# Patient Record
Sex: Female | Born: 1970 | Race: Black or African American | Hispanic: No | Smoking: Never smoker
Health system: Southern US, Community
[De-identification: ages and names within clinical notes are randomized; demographics above are authoritative.]

## PROBLEM LIST (undated history)

## (undated) DIAGNOSIS — E119 Type 2 diabetes mellitus without complications: Secondary | ICD-10-CM

## (undated) DIAGNOSIS — I10 Essential (primary) hypertension: Secondary | ICD-10-CM

---
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
## 2001-03-28 ENCOUNTER — Other Ambulatory Visit: Admission: RE | Admit: 2001-03-28 | Discharge: 2001-03-28 | Payer: Self-pay | Admitting: Obstetrics and Gynecology

## 2002-08-13 ENCOUNTER — Other Ambulatory Visit: Admission: RE | Admit: 2002-08-13 | Discharge: 2002-08-13 | Payer: Self-pay | Admitting: *Deleted

## 2004-03-27 ENCOUNTER — Other Ambulatory Visit: Admission: RE | Admit: 2004-03-27 | Discharge: 2004-03-27 | Payer: Self-pay | Admitting: Gynecology

## 2004-11-08 ENCOUNTER — Encounter: Admission: RE | Admit: 2004-11-08 | Discharge: 2004-11-08 | Payer: Self-pay | Admitting: Obstetrics and Gynecology

## 2004-12-14 ENCOUNTER — Inpatient Hospital Stay (HOSPITAL_COMMUNITY): Admission: AD | Admit: 2004-12-14 | Discharge: 2004-12-14 | Payer: Self-pay | Admitting: Obstetrics and Gynecology

## 2004-12-29 ENCOUNTER — Inpatient Hospital Stay (HOSPITAL_COMMUNITY): Admission: AD | Admit: 2004-12-29 | Discharge: 2004-12-29 | Payer: Self-pay | Admitting: Obstetrics and Gynecology

## 2004-12-29 ENCOUNTER — Inpatient Hospital Stay (HOSPITAL_COMMUNITY): Admission: AD | Admit: 2004-12-29 | Discharge: 2005-01-01 | Payer: Self-pay | Admitting: Obstetrics and Gynecology

## 2005-01-02 ENCOUNTER — Encounter: Admission: RE | Admit: 2005-01-02 | Discharge: 2005-02-01 | Payer: Self-pay | Admitting: Obstetrics and Gynecology

## 2005-02-02 ENCOUNTER — Encounter: Admission: RE | Admit: 2005-02-02 | Discharge: 2005-03-04 | Payer: Self-pay | Admitting: Obstetrics and Gynecology

## 2005-03-05 ENCOUNTER — Encounter: Admission: RE | Admit: 2005-03-05 | Discharge: 2005-04-03 | Payer: Self-pay | Admitting: Obstetrics and Gynecology

## 2005-04-04 ENCOUNTER — Encounter: Admission: RE | Admit: 2005-04-04 | Discharge: 2005-05-04 | Payer: Self-pay | Admitting: Obstetrics and Gynecology

## 2005-05-05 ENCOUNTER — Encounter: Admission: RE | Admit: 2005-05-05 | Discharge: 2005-06-03 | Payer: Self-pay | Admitting: Obstetrics and Gynecology

## 2005-06-04 ENCOUNTER — Encounter: Admission: RE | Admit: 2005-06-04 | Discharge: 2005-07-04 | Payer: Self-pay | Admitting: Obstetrics and Gynecology

## 2005-07-05 ENCOUNTER — Encounter: Admission: RE | Admit: 2005-07-05 | Discharge: 2005-08-04 | Payer: Self-pay | Admitting: Obstetrics and Gynecology

## 2005-08-05 ENCOUNTER — Encounter: Admission: RE | Admit: 2005-08-05 | Discharge: 2005-09-01 | Payer: Self-pay | Admitting: Obstetrics and Gynecology

## 2005-09-02 ENCOUNTER — Encounter: Admission: RE | Admit: 2005-09-02 | Discharge: 2005-10-02 | Payer: Self-pay | Admitting: Obstetrics and Gynecology

## 2005-10-03 ENCOUNTER — Encounter: Admission: RE | Admit: 2005-10-03 | Discharge: 2005-10-11 | Payer: Self-pay | Admitting: Obstetrics and Gynecology

## 2006-08-24 LAB — CONVERTED CEMR LAB: Pap Smear: NORMAL

## 2007-05-20 ENCOUNTER — Ambulatory Visit: Payer: Self-pay | Admitting: Internal Medicine

## 2007-05-20 DIAGNOSIS — O99810 Abnormal glucose complicating pregnancy: Secondary | ICD-10-CM | POA: Insufficient documentation

## 2007-05-20 LAB — CONVERTED CEMR LAB
Blood in Urine, dipstick: NEGATIVE
Glucose, Urine, Semiquant: NEGATIVE
Nitrite: NEGATIVE
Specific Gravity, Urine: 1.02
Urobilinogen, UA: 0.2
WBC Urine, dipstick: NEGATIVE
pH: 7

## 2007-05-21 LAB — CONVERTED CEMR LAB
ALT: 20 units/L (ref 0–35)
AST: 17 units/L (ref 0–37)
Albumin: 3.4 g/dL — ABNORMAL LOW (ref 3.5–5.2)
Alkaline Phosphatase: 69 units/L (ref 39–117)
BUN: 10 mg/dL (ref 6–23)
Basophils Absolute: 0 10*3/uL (ref 0.0–0.1)
Basophils Relative: 0.4 % (ref 0.0–1.0)
Bilirubin, Direct: 0.1 mg/dL (ref 0.0–0.3)
CO2: 30 meq/L (ref 19–32)
Calcium: 8.9 mg/dL (ref 8.4–10.5)
Chloride: 104 meq/L (ref 96–112)
Cholesterol: 166 mg/dL (ref 0–200)
Creatinine, Ser: 0.8 mg/dL (ref 0.4–1.2)
Eosinophils Absolute: 0.3 10*3/uL (ref 0.0–0.6)
Eosinophils Relative: 3.1 % (ref 0.0–5.0)
GFR calc Af Amer: 106 mL/min
GFR calc non Af Amer: 87 mL/min
Glucose, Bld: 90 mg/dL (ref 70–99)
HCT: 38.2 % (ref 36.0–46.0)
HDL: 31.8 mg/dL — ABNORMAL LOW (ref >39.0)
Hemoglobin: 12.8 g/dL (ref 12.0–15.0)
LDL Cholesterol: 121 mg/dL — ABNORMAL HIGH (ref 0–99)
Lymphocytes Relative: 28.6 % (ref 12.0–46.0)
MCHC: 33.5 g/dL (ref 30.0–36.0)
MCV: 89.8 fL (ref 78.0–100.0)
Monocytes Absolute: 0.7 10*3/uL (ref 0.2–0.7)
Monocytes Relative: 7.7 % (ref 3.0–11.0)
Neutro Abs: 5.4 10*3/uL (ref 1.4–7.7)
Neutrophils Relative %: 60.2 % (ref 43.0–77.0)
Platelets: 337 10*3/uL (ref 150–400)
Potassium: 4.3 meq/L (ref 3.5–5.1)
RBC: 4.25 M/uL (ref 3.87–5.11)
RDW: 13.7 % (ref 11.5–14.6)
Sodium: 140 meq/L (ref 135–145)
TSH: 1.55 microintl units/mL (ref 0.35–5.50)
Total Bilirubin: 0.6 mg/dL (ref 0.3–1.2)
Total CHOL/HDL Ratio: 5.2
Total Protein: 6.6 g/dL (ref 6.0–8.3)
Triglycerides: 66 mg/dL (ref 0–149)
VLDL: 13 mg/dL (ref 0–40)
WBC: 9 10*3/uL (ref 4.5–10.5)

## 2008-01-21 ENCOUNTER — Ambulatory Visit: Payer: Self-pay | Admitting: Internal Medicine

## 2008-01-21 LAB — CONVERTED CEMR LAB
ALT: 28 units/L (ref 0–35)
AST: 19 units/L (ref 0–37)
Albumin: 3.7 g/dL (ref 3.5–5.2)
BUN: 13 mg/dL (ref 6–23)
Basophils Relative: 0.7 % (ref 0.0–3.0)
Chloride: 105 meq/L (ref 96–112)
Creatinine, Ser: 0.9 mg/dL (ref 0.4–1.2)
Eosinophils Absolute: 0.3 10*3/uL (ref 0.0–0.7)
Eosinophils Relative: 4.5 % (ref 0.0–5.0)
GFR calc non Af Amer: 76 mL/min
Glucose, Urine, Semiquant: NEGATIVE
HCT: 40.9 % (ref 36.0–46.0)
MCV: 90.7 fL (ref 78.0–100.0)
Monocytes Absolute: 0.6 10*3/uL (ref 0.1–1.0)
Neutrophils Relative %: 55 % (ref 43.0–77.0)
RBC: 4.51 M/uL (ref 3.87–5.11)
Specific Gravity, Urine: 1.015
TSH: 1.25 microintl units/mL (ref 0.35–5.50)
WBC Urine, dipstick: NEGATIVE
WBC: 7 10*3/uL (ref 4.5–10.5)
pH: 7

## 2008-01-28 ENCOUNTER — Ambulatory Visit: Payer: Self-pay | Admitting: Internal Medicine

## 2009-06-29 ENCOUNTER — Encounter: Admission: RE | Admit: 2009-06-29 | Discharge: 2009-09-27 | Payer: Self-pay | Admitting: Certified Nurse Midwife

## 2009-10-05 ENCOUNTER — Encounter: Admission: RE | Admit: 2009-10-05 | Discharge: 2009-10-05 | Payer: Self-pay | Admitting: Certified Nurse Midwife

## 2010-01-23 ENCOUNTER — Encounter: Admission: RE | Admit: 2010-01-23 | Discharge: 2010-01-23 | Payer: Self-pay | Admitting: Certified Nurse Midwife

## 2010-02-23 ENCOUNTER — Inpatient Hospital Stay (HOSPITAL_COMMUNITY): Admission: AD | Admit: 2010-02-23 | Discharge: 2010-02-23 | Payer: Self-pay | Admitting: Obstetrics & Gynecology

## 2010-02-28 ENCOUNTER — Inpatient Hospital Stay (HOSPITAL_COMMUNITY): Admission: AD | Admit: 2010-02-28 | Discharge: 2010-02-28 | Payer: Self-pay | Admitting: Obstetrics

## 2010-03-05 ENCOUNTER — Inpatient Hospital Stay (HOSPITAL_COMMUNITY): Admission: AD | Admit: 2010-03-05 | Discharge: 2010-03-07 | Payer: Self-pay | Admitting: Obstetrics

## 2010-06-01 ENCOUNTER — Encounter: Admit: 2010-06-01 | Payer: Self-pay | Admitting: Certified Nurse Midwife

## 2010-07-25 ENCOUNTER — Emergency Department (HOSPITAL_COMMUNITY)
Admission: EM | Admit: 2010-07-25 | Discharge: 2010-07-25 | Disposition: A | Discharge status: Home / Self Care | Payer: BC Managed Care – PPO | Attending: Emergency Medicine | Admitting: Emergency Medicine

## 2010-07-25 DIAGNOSIS — R51 Headache: Secondary | ICD-10-CM | POA: Insufficient documentation

## 2010-07-25 DIAGNOSIS — R0602 Shortness of breath: Secondary | ICD-10-CM | POA: Insufficient documentation

## 2010-09-07 LAB — COMPREHENSIVE METABOLIC PANEL
ALT: 10 U/L (ref 0–35)
AST: 14 U/L (ref 0–37)
AST: 15 U/L (ref 0–37)
AST: 16 U/L (ref 0–37)
Albumin: 2.6 g/dL — ABNORMAL LOW (ref 3.5–5.2)
Alkaline Phosphatase: 314 U/L — ABNORMAL HIGH (ref 39–117)
BUN: 10 mg/dL (ref 6–23)
BUN: 8 mg/dL (ref 6–23)
CO2: 23 mEq/L (ref 19–32)
CO2: 23 mEq/L (ref 19–32)
CO2: 23 mEq/L (ref 19–32)
Calcium: 8.7 mg/dL (ref 8.4–10.5)
Calcium: 9.8 mg/dL (ref 8.4–10.5)
Chloride: 104 mEq/L (ref 96–112)
Chloride: 107 mEq/L (ref 96–112)
Chloride: 109 mEq/L (ref 96–112)
Creatinine, Ser: 0.64 mg/dL (ref 0.4–1.2)
Creatinine, Ser: 0.66 mg/dL (ref 0.4–1.2)
Creatinine, Ser: 0.71 mg/dL (ref 0.4–1.2)
GFR calc Af Amer: >60 mL/min (ref >60)
GFR calc Af Amer: >60 mL/min (ref >60)
GFR calc Af Amer: >60 mL/min (ref >60)
GFR calc non Af Amer: >60 mL/min (ref >60)
GFR calc non Af Amer: >60 mL/min (ref >60)
GFR calc non Af Amer: >60 mL/min (ref >60)
Glucose, Bld: 109 mg/dL — ABNORMAL HIGH (ref 70–99)
Potassium: 4 mEq/L (ref 3.5–5.1)
Sodium: 136 mEq/L (ref 135–145)
Total Bilirubin: 0.1 mg/dL — ABNORMAL LOW (ref 0.3–1.2)
Total Bilirubin: 0.3 mg/dL (ref 0.3–1.2)
Total Bilirubin: 0.6 mg/dL (ref 0.3–1.2)
Total Protein: 6.7 g/dL (ref 6.0–8.3)

## 2010-09-07 LAB — CBC
HCT: 30 % — ABNORMAL LOW (ref 36.0–46.0)
HCT: 35.7 % — ABNORMAL LOW (ref 36.0–46.0)
Hemoglobin: 10.1 g/dL — ABNORMAL LOW (ref 12.0–15.0)
Hemoglobin: 11.7 g/dL — ABNORMAL LOW (ref 12.0–15.0)
Hemoglobin: 11.7 g/dL — ABNORMAL LOW (ref 12.0–15.0)
Hemoglobin: 11.8 g/dL — ABNORMAL LOW (ref 12.0–15.0)
MCH: 28.9 pg (ref 26.0–34.0)
MCH: 29 pg (ref 26.0–34.0)
MCH: 29.4 pg (ref 26.0–34.0)
MCHC: 32.9 g/dL (ref 30.0–36.0)
MCHC: 33.2 g/dL (ref 30.0–36.0)
MCV: 87.6 fL (ref 78.0–100.0)
MCV: 87.8 fL (ref 78.0–100.0)
MCV: 88 fL (ref 78.0–100.0)
Platelets: 294 10*3/uL (ref 150–400)
RBC: 3.41 MIL/uL — ABNORMAL LOW (ref 3.87–5.11)
RBC: 4.03 MIL/uL (ref 3.87–5.11)
RBC: 4.07 MIL/uL (ref 3.87–5.11)
RDW: 14.5 % (ref 11.5–15.5)

## 2010-09-07 LAB — URIC ACID
Uric Acid, Serum: 4.6 mg/dL (ref 2.4–7.0)
Uric Acid, Serum: 4.7 mg/dL (ref 2.4–7.0)

## 2010-09-07 LAB — URINALYSIS, ROUTINE W REFLEX MICROSCOPIC
Bilirubin Urine: NEGATIVE
Glucose, UA: NEGATIVE mg/dL
Ketones, ur: 15 mg/dL — AB
Protein, ur: NEGATIVE mg/dL

## 2010-09-07 LAB — GLUCOSE, CAPILLARY: Glucose-Capillary: 97 mg/dL (ref 70–99)

## 2010-09-07 LAB — RPR: RPR Ser Ql: NONREACTIVE

## 2010-09-07 LAB — LACTATE DEHYDROGENASE: LDH: 205 U/L (ref 94–250)

## 2010-11-10 NOTE — H&P (Signed)
NAMESena Hoffman, [name]                ACCOUNT NO.:  [id number]   MEDICAL RECORD NO.:  [id number]           PATIENT TYPE:   LOCATION:                                 FACILITY:   PHYSICIAN:  Richardean Sale, M.D.   DATE OF BIRTH:  02-23-1971   DATE OF ADMISSION:  12/29/2004  DATE OF DISCHARGE:                                HISTORY & PHYSICAL   ADMISSION DIAGNOSES:  37 plus week intrauterine pregnancy with  oligohydramnios, possible rupture of membranes for induction of labor.   HISTORY OF PRESENT ILLNESS:  This is a 40 year old, gravida 1, para 0,  African-American female who is currently 37 weeks and 5 days by first  trimester ultrasound with a due date of January 14, 2005 who had complaints of  questionable loss of fluid. Makayla and Nitrazine tests were negative, however,  ultrasound was obtained which revealed an AFI of only 4.2 cm. Given this  finding, the patient is being admitted for induction of labor with presumed  rupture of membranes. She denies any significant contractions, denies any  vaginal bleeding and reports good movement. Prenatal care has been at  [facility] with Dr. Richardean Sale as the primary attending. Pregnancy  is complicated by gestational diabetes which is diet controlled and obesity.  Ultrasound performed on December 22, 2004 revealed estimated fetal weight of  3260 g which is the 78 percentile and an AFI of 11.   PAST OBSTETRIC HISTORY:  Gravida 1, para 0.   GYNECOLOGIC HISTORY:  Positive for cryosurgery for abnormal Pap smear.  Denies any history of HSV or other sexually transmitted infections.   PAST MEDICAL HISTORY:  No prior hospitalizations.   PAST SURGICAL HISTORY:  Right wrist fracture March of 2004, right breast  mass removed in 1989 which is benign and cyst removed from right wrist was  benign.   FAMILY HISTORY:  Positive for diabetes and hypertension, negative for any  birth defects or congenital anomalies or cystic fibrosis.   SOCIAL HISTORY:   She denies tobacco, alcohol or drugs. Father of the baby is  supportive. She works as a Firefighter.   PHYSICAL EXAMINATION:  Weight is 233 pounds, blood pressure 120/72.  GENERAL:  She is an obese black female who is in no acute distress.  HEART:  Regular rate and rhythm.  LUNGS:  Clear to auscultation bilaterally.  ABDOMEN:  Gravid, soft, nontender. Fundal height is 39.  EXTREMITIES:  Within normal limits.  SKIN:  Shows vitiligo.   On speculum exam, there is some white fluid present in the vault which is  negative for Nitrazine or ferning. Cervix is closed, soft, 50%, -2 station.  Ultrasound today shows an AFI of only 4 but cephalic presentation.   PRENATAL LABS:  Blood pressure type B+, antibody screen negative, RPR  nonreactive, rubella immune. Hepatitis B surface antigen nonreactive, HIV  nonreactive. Sickle cell trait normal, thalassemia negative. One hour  Glucola was 153, three hour glucose challenge 98, 179, 202, 150. Group B  beta strep negative on December 14, 2004. Declined triple screen or any  antenatal testing. Anatomy ultrasound  within normal limits. Fasting blood  sugars are all less than 95. Two hour post prandial is less than 120 with  only an occasional elevated value.   ASSESSMENT:  A 40 year old, gravida 1, para 0, black female with gestational  diabetes diet controlled now with oligohydramnios and presumed rupture of  membranes, unfavorable cervix.   PLAN:  Will admit to Labor and Delivery for continuous monitoring and  cervical ripening with Cervidil. Given unfavorable cervix, I explained to  the patient she is at increased risk for a cesarean section. The patient  voiced an understanding of the above. Will begin Cervidil and start low dose  pitocin after ripening achieved.       JW/MEDQ  D:  12/29/2004  T:  12/29/2004  Job:  [id number]

## 2010-11-10 NOTE — H&P (Signed)
NAME:  Makayla Hoffman, Makayla Hoffman                ACCOUNT NO.:  [id number]   MEDICAL RECORD NO.:  [id number]          PATIENT TYPE:  INP   LOCATION:  9170                          FACILITY:  [facility]   PHYSICIAN:  Owendale B. Earlene Plater, M.D.  DATE OF BIRTH:  May 02, 1971   DATE OF ADMISSION:  12/29/2004  DATE OF DISCHARGE:                                HISTORY & PHYSICAL   ADMISSION DIAGNOSES:  1.  Thirty-seven week intrauterine pregnancy.  2.  Questionable prolonged rupture of membranes.  3.  Oligohydramnios.  4.  Gestational diabetes.   HISTORY OF PRESENT ILLNESS:  A 40 year old African-American female, gravida  1, para 0, at 37+ weeks, who presents with an approximately two-day history  of questionable leakage of fluid.  Repetitive sterile speculum exams have  shown no evidence of ruptured membranes; however, in the office yesterday  ultrasound showed her AFI was 4.  Given the history of possible leakage and  the oligohydramnios, the patient is admitted for induction of labor.  History of gestational diabetes, diet-controlled, and a history of  cryotherapy.   Past medical history, past surgical history, family history, see prenatal  record.   PRENATAL LABORATORY DATA:  See prenatal record.   PHYSICAL EXAMINATION:  VITAL SIGNS:  The patient is noted to be afebrile  with stable vitals.  GENERAL:  She is alert and oriented, in no acute distress.  SKIN:  Warm and dry, no lesions.  CARDIAC:  Regular rate and rhythm.  CHEST:  Lungs clear to auscultation.  ABDOMEN:  Gravid.  Fundus is nontender.  It is difficult to estimate the  fetal weight due to the patient's obesity.  PELVIC:  Cervix is closed and 90% effaced, 0 station.  MONITORING:  Fetal heart tones are reassuring in the 140s.   ASSESSMENT:  1.  A 37+ week intrauterine pregnancy.  2.  Diet-controlled gestational diabetes.  3.  Possible prolonged rupture of membranes.  4.  Oligohydramnios.   PLAN:  Admission for cervical ripening, induction of  labor.  Will also check  a fingerstick blood sugar, given her history of gestational diabetes.       WBD/MEDQ  D:  12/30/2004  T:  12/30/2004  Job:  [id number]

## 2012-07-16 ENCOUNTER — Other Ambulatory Visit: Payer: Self-pay | Admitting: Certified Nurse Midwife

## 2012-07-16 DIAGNOSIS — Z1231 Encounter for screening mammogram for malignant neoplasm of breast: Secondary | ICD-10-CM

## 2012-08-08 ENCOUNTER — Ambulatory Visit: Payer: BC Managed Care – PPO

## 2012-08-15 ENCOUNTER — Ambulatory Visit
Admission: RE | Admit: 2012-08-15 | Discharge: 2012-08-15 | Disposition: A | Discharge status: Home / Self Care | Payer: 59 | Source: Ambulatory Visit | Attending: Certified Nurse Midwife | Admitting: Certified Nurse Midwife

## 2012-08-15 DIAGNOSIS — Z1231 Encounter for screening mammogram for malignant neoplasm of breast: Secondary | ICD-10-CM

## 2012-08-18 ENCOUNTER — Other Ambulatory Visit: Payer: Self-pay | Admitting: Certified Nurse Midwife

## 2012-08-18 DIAGNOSIS — R928 Other abnormal and inconclusive findings on diagnostic imaging of breast: Secondary | ICD-10-CM

## 2012-08-25 ENCOUNTER — Other Ambulatory Visit: Payer: Self-pay | Admitting: Certified Nurse Midwife

## 2012-08-25 ENCOUNTER — Ambulatory Visit
Admission: RE | Admit: 2012-08-25 | Discharge: 2012-08-25 | Disposition: A | Discharge status: Home / Self Care | Payer: 59 | Source: Ambulatory Visit | Attending: Certified Nurse Midwife | Admitting: Certified Nurse Midwife

## 2012-08-25 DIAGNOSIS — R928 Other abnormal and inconclusive findings on diagnostic imaging of breast: Secondary | ICD-10-CM

## 2013-02-02 ENCOUNTER — Other Ambulatory Visit: Payer: Self-pay | Admitting: Physician Assistant

## 2013-02-02 DIAGNOSIS — D242 Benign neoplasm of left breast: Secondary | ICD-10-CM

## 2013-02-02 DIAGNOSIS — N6489 Other specified disorders of breast: Secondary | ICD-10-CM

## 2013-02-26 ENCOUNTER — Other Ambulatory Visit: Payer: Self-pay | Admitting: Physician Assistant

## 2013-02-26 ENCOUNTER — Ambulatory Visit
Admission: RE | Admit: 2013-02-26 | Discharge: 2013-02-26 | Disposition: A | Discharge status: Home / Self Care | Payer: 59 | Source: Ambulatory Visit | Attending: Physician Assistant | Admitting: Physician Assistant

## 2013-02-26 DIAGNOSIS — N6489 Other specified disorders of breast: Secondary | ICD-10-CM

## 2013-02-26 DIAGNOSIS — D242 Benign neoplasm of left breast: Secondary | ICD-10-CM

## 2013-06-29 ENCOUNTER — Ambulatory Visit (HOSPITAL_COMMUNITY): Payer: 59 | Attending: Family Medicine | Admitting: Radiology

## 2013-06-29 ENCOUNTER — Other Ambulatory Visit (HOSPITAL_COMMUNITY): Payer: Self-pay | Admitting: Radiology

## 2013-06-29 ENCOUNTER — Encounter: Payer: Self-pay | Admitting: Cardiovascular Disease

## 2013-06-29 DIAGNOSIS — R011 Cardiac murmur, unspecified: Secondary | ICD-10-CM

## 2013-06-29 DIAGNOSIS — I10 Essential (primary) hypertension: Secondary | ICD-10-CM | POA: Insufficient documentation

## 2013-06-29 DIAGNOSIS — Z6841 Body Mass Index (BMI) 40.0 and over, adult: Secondary | ICD-10-CM | POA: Insufficient documentation

## 2013-06-29 DIAGNOSIS — E785 Hyperlipidemia, unspecified: Secondary | ICD-10-CM | POA: Insufficient documentation

## 2013-06-29 DIAGNOSIS — E669 Obesity, unspecified: Secondary | ICD-10-CM | POA: Insufficient documentation

## 2013-06-29 NOTE — Progress Notes (Signed)
Echocardiogram performed.  

## 2013-09-04 ENCOUNTER — Other Ambulatory Visit: Payer: Self-pay | Admitting: Certified Nurse Midwife

## 2013-09-04 DIAGNOSIS — D249 Benign neoplasm of unspecified breast: Secondary | ICD-10-CM

## 2013-09-16 ENCOUNTER — Other Ambulatory Visit: Payer: 59

## 2013-09-29 ENCOUNTER — Other Ambulatory Visit: Payer: 59

## 2013-09-30 ENCOUNTER — Other Ambulatory Visit: Payer: 59

## 2013-10-14 ENCOUNTER — Encounter (INDEPENDENT_AMBULATORY_CARE_PROVIDER_SITE_OTHER): Payer: Self-pay

## 2013-10-14 ENCOUNTER — Ambulatory Visit
Admission: RE | Admit: 2013-10-14 | Discharge: 2013-10-14 | Disposition: A | Discharge status: Home / Self Care | Payer: 59 | Source: Ambulatory Visit | Attending: Certified Nurse Midwife | Admitting: Certified Nurse Midwife

## 2013-10-14 ENCOUNTER — Ambulatory Visit
Admission: RE | Admit: 2013-10-14 | Discharge: 2013-10-14 | Disposition: A | Discharge status: Home / Self Care | Payer: Self-pay | Source: Ambulatory Visit | Attending: Certified Nurse Midwife | Admitting: Certified Nurse Midwife

## 2013-10-14 DIAGNOSIS — D249 Benign neoplasm of unspecified breast: Secondary | ICD-10-CM

## 2014-09-20 ENCOUNTER — Ambulatory Visit: Payer: Self-pay | Admitting: Dietician

## 2015-02-01 ENCOUNTER — Other Ambulatory Visit: Payer: Self-pay

## 2015-02-01 DIAGNOSIS — N6459 Other signs and symptoms in breast: Secondary | ICD-10-CM

## 2015-02-01 DIAGNOSIS — N63 Unspecified lump in unspecified breast: Secondary | ICD-10-CM

## 2015-02-03 ENCOUNTER — Other Ambulatory Visit: Payer: Self-pay | Admitting: Family Medicine

## 2015-02-03 DIAGNOSIS — N63 Unspecified lump in unspecified breast: Secondary | ICD-10-CM

## 2015-02-04 ENCOUNTER — Other Ambulatory Visit: Payer: Self-pay

## 2015-02-04 ENCOUNTER — Ambulatory Visit
Admission: RE | Admit: 2015-02-04 | Discharge: 2015-02-04 | Disposition: A | Discharge status: Home / Self Care | Payer: 59 | Source: Ambulatory Visit | Attending: Family Medicine | Admitting: Family Medicine

## 2015-02-04 DIAGNOSIS — N63 Unspecified lump in unspecified breast: Secondary | ICD-10-CM

## 2015-05-22 ENCOUNTER — Encounter (HOSPITAL_COMMUNITY): Payer: Self-pay | Admitting: *Deleted

## 2015-05-22 ENCOUNTER — Emergency Department (HOSPITAL_COMMUNITY)
Admission: EM | Admit: 2015-05-22 | Discharge: 2015-05-22 | Disposition: A | Discharge status: Home / Self Care | Payer: 59 | Attending: Emergency Medicine | Admitting: Emergency Medicine

## 2015-05-22 DIAGNOSIS — I10 Essential (primary) hypertension: Secondary | ICD-10-CM | POA: Diagnosis not present

## 2015-05-22 DIAGNOSIS — T23291A Burn of second degree of multiple sites of right wrist and hand, initial encounter: Secondary | ICD-10-CM | POA: Insufficient documentation

## 2015-05-22 DIAGNOSIS — Y999 Unspecified external cause status: Secondary | ICD-10-CM | POA: Diagnosis not present

## 2015-05-22 DIAGNOSIS — E119 Type 2 diabetes mellitus without complications: Secondary | ICD-10-CM | POA: Diagnosis not present

## 2015-05-22 DIAGNOSIS — Z23 Encounter for immunization: Secondary | ICD-10-CM | POA: Diagnosis not present

## 2015-05-22 DIAGNOSIS — Y9289 Other specified places as the place of occurrence of the external cause: Secondary | ICD-10-CM | POA: Insufficient documentation

## 2015-05-22 DIAGNOSIS — X102XXA Contact with fats and cooking oils, initial encounter: Secondary | ICD-10-CM | POA: Insufficient documentation

## 2015-05-22 DIAGNOSIS — T23221A Burn of second degree of single right finger (nail) except thumb, initial encounter: Secondary | ICD-10-CM | POA: Insufficient documentation

## 2015-05-22 DIAGNOSIS — T23211A Burn of second degree of right thumb (nail), initial encounter: Secondary | ICD-10-CM | POA: Insufficient documentation

## 2015-05-22 DIAGNOSIS — Y93G3 Activity, cooking and baking: Secondary | ICD-10-CM | POA: Diagnosis not present

## 2015-05-22 DIAGNOSIS — T23201A Burn of second degree of right hand, unspecified site, initial encounter: Secondary | ICD-10-CM

## 2015-05-22 HISTORY — DX: Essential (primary) hypertension: I10

## 2015-05-22 HISTORY — DX: Type 2 diabetes mellitus without complications: E11.9

## 2015-05-22 MED ORDER — OXYCODONE-ACETAMINOPHEN 7.5-325 MG PO TABS: 1.0000 | ORAL_TABLET | Freq: Four times a day (QID) | ORAL | Status: AC | PRN

## 2015-05-22 MED ORDER — SILVER SULFADIAZINE 1 % EX CREA
TOPICAL_CREAM | Freq: Once | CUTANEOUS | Status: AC
  Administered 2015-05-22: 21:00:00 via TOPICAL
  Filled 2015-05-22: qty 85

## 2015-05-22 MED ORDER — TETANUS-DIPHTH-ACELL PERTUSSIS 5-2.5-18.5 LF-MCG/0.5 IM SUSP
0.5000 mL | Freq: Once | INTRAMUSCULAR | Status: AC
  Administered 2015-05-22: 0.5 mL via INTRAMUSCULAR
  Filled 2015-05-22: qty 0.5

## 2015-05-22 MED ORDER — OXYCODONE-ACETAMINOPHEN 5-325 MG PO TABS
1.0000 | ORAL_TABLET | Freq: Once | ORAL | Status: AC
  Administered 2015-05-22: 1 via ORAL
  Filled 2015-05-22: qty 1

## 2015-05-22 NOTE — ED Provider Notes (Signed)
CSN: [id number]     Arrival date & time 05/22/15  2025 History   By signing my name below, I, Randa Evens, attest that this documentation has been prepared under the direction and in the presence of Debroah Baller, NP. Electronically Signed: Randa Evens, ED Scribe. 05/22/2015. 9:12 PM.      Chief Complaint  Patient presents with  . Hand Burn   Patient is a 44 y.o. female presenting with burn. The history is provided by the patient. No language interpreter was used.  Burn Burn location:  Hand Hand burn location:  R hand Burn quality:  Painful and red Time since incident:  45 minutes Pain details:    Duration:  45 minutes Mechanism of burn:  Hot liquid Relieved by:  Nothing Ineffective treatments: aloe. Tetanus status:  Unknown  HPI Comments: Makayla Hoffman is a 44 y.o. female who presents to the Emergency Department complaining of burn to right hand 45 minutes PTA. Pt states the hot greased splashed up on to her hand. Pt rates the severity of her pain 10/10. Pt states that she has tried aloe gel with no relief. Pt states that her tetanus is unknown.      Past Medical History  Diagnosis Date  . Hypertension   . Diabetes mellitus without complication ([place])    History reviewed. No pertinent past surgical history. No family history on file. Social History  Substance Use Topics  . Smoking status: Never Smoker   . Smokeless tobacco: None  . Alcohol Use: No   OB History    No data available      Review of Systems  Skin:       Burn to the right hand  All other systems reviewed and are negative.    Allergies  Review of patient's allergies indicates no known allergies.  Home Medications   Prior to Admission medications   Medication Sig Start Date End Date Taking? Authorizing Provider  oxyCODONE-acetaminophen (PERCOCET) 7.5-325 MG tablet Take 1 tablet by mouth every 6 (six) hours as needed for severe pain. 05/22/15   Kelilah Hebard Bunnie Pion, NP   BP 170/107 mmHg  Pulse 71   Temp(Src) 97.8 F (36.6 C) (Oral)  Resp 16  SpO2 99%  LMP 05/15/2015   Physical Exam  Constitutional: She is oriented to person, place, and time. She appears well-developed and well-nourished. No distress.  HENT:  Head: Normocephalic and atraumatic.  Eyes: EOM are normal.  Neck: Neck supple. No tracheal deviation present.  Cardiovascular: Normal rate.   Pulmonary/Chest: Effort normal. No respiratory distress.  Musculoskeletal: Normal range of motion.       Hands: Normal range of motion, adequate circulation  Neurological: She is alert and oriented to person, place, and time.  Skin: Skin is warm and dry. Burn noted.  Erythema and tiny blisters to web space between right thumb and index finger, erythema to thenar eminence.   Psychiatric: She has a normal mood and affect. Her behavior is normal.  Nursing note and vitals reviewed.   ED Course  Procedures (including critical care time) Soaked in normal saline, silvadene burn dressing, pain management tetanus updated   DIAGNOSTIC STUDIES: Oxygen Saturation is 99% on RA, normal by my interpretation.    COORDINATION OF CARE: 9:12 PM-Discussed treatment plan with pt at bedside and pt agreed to plan.      MDM  44 y.o. female with burn to the right hand. Stable for d/c without focal neuro deficits. Discussed with the patient in detail  need for return in the morning for dressing change and wound check. Discussed that she may need follow up with hand due to the possible scaring that could occur and contractures. Patient voices understanding and agrees to follow up.   Final diagnoses:  Burn of right hand, second degree, initial encounter    I personally performed the services described in this documentation, which was scribed in my presence. The recorded information has been reviewed and is accurate.      Benson, NP 05/22/15 2117  Forde Dandy, MD 05/23/15 Drema Halon

## 2015-05-22 NOTE — ED Notes (Signed)
Discharge instruction and prescription given - voiced understanding.  Will return tomorrow for recheck

## 2015-05-22 NOTE — ED Notes (Signed)
Cream and dressing applied to right hand.  Patient states it hurts more than when it was in the ice saline.

## 2015-05-22 NOTE — Discharge Instructions (Signed)
Continue to take your ibuprofen. Do not take the narcotic if driving as it will make you sleepy. Return here in the morning for dressing change and wound recheck.   Second-Degree Burn A second-degree burn affects the 2 outer layers of skin. The outer layer (epidermis) and the layer underneath it (dermis) are both burned. Another name for this type of burn is a partial thickness burn. A second-degree burn may be called minor or major. This depends on the size of the burn. It also depends on what parts of the skin are burned. Minor burns may be treated with first aid. Major burns are a medical emergency. A second-degree burn is worse than a first-degree burn, but not as bad as a third-degree burn. A first-degree burn affects only the epidermis. A third-degree burn goes through all the layers of skin. A second-degree burn usually heals in 3 to 4 weeks. A minor second-degree burn usually does not leave a scar.Deeper second-degree burns may lead to scarring of the skin or contractures over joints.Contractures are scars that form over joints and may lead to reduced mobility at those joints. CAUSES  Heat (thermal) injury. This happens when skin comes in contact with something very hot. It could be a flame, a hot object, hot liquid, or steam. Most second-degree burns are thermal injuries.  Radiation. Sunlight is one type of radiation that can burn the skin. Another type of radiation is used to heat food. Radiation is also used to treat some diseases, such as cancer. All types of radiation can burn the skin. Sunlight usually causes a first-degree burn. Radiation used for heating food or treating a disease can cause a second-degree burn.  Electricity. Electrical burns can cause more damage under the skin than on the surface. They should always be treated as major burns.  Chemicals. Many chemicals can burn the skin. The burn should be flushed with cool water and checked by an emergency  caregiver. SYMPTOMS Symptoms of second-degree burns include:  Severe pain.  Extreme tenderness.  Deep redness.  Blistered skin.  Skin that has changed color.It might look blotchy, wet, or shiny.  Swelling. TREATMENT Some second-degree burns may need to be treated in a hospital. These include major burns, electrical burns, and chemical burns. Many other second-degree burns can be treated with regular first aid, such as:  Cooling the burn. Use cool, germ-free (sterile) salt water. Place the burned area of skin into a tub of water, or cover the burned area with clean, wet towels.  Taking pain medicine.  Removing the dead skin from broken blisters. A trained caregiver may do this. Do not pop blisters.  Gently washing your skin with mild soap.  Covering the burned area with a cream.Silver sulfadiazine is a cream for burns. An antibiotic cream, such as bacitracin, may also be used to fight infection. Do not use other ointments or creams unless your caregiver says it is okay.  Protecting the burn with a sterile, non-sticky bandage.  Bandaging fingers and toes separately. This keeps them from sticking together.  Taking an antibiotic. This can help prevent infection.  Getting a tetanus shot. HOME CARE INSTRUCTIONS Medication  Take any medicine prescribed by your caregiver. Follow the directions carefully.  Ask your caregiver if you can take over-the-counter medicine to relieve pain and swelling. Do not give aspirin to children.  Make sure your caregiver knows about all other medicines you take.This includes over-the-counter medicines. Burn care  You will need to change the bandage on your burn.  You may need to do this 2 or 3 times each day.  Gently clean the burned area.  Put ointment on it.  Cover the burn with a sterile bandage.  For some deeper burns or burns that cover a large area, compression garments may be prescribed. These garments can help minimize scarring  and protect your mobility.  Do not put butter or oil on your skin. Use only the cream prescribed by your caregiver.  Do not put ice on your burn.  Do not break blisters on your skin.  Keep the bandaged area dry. You might need to take a sponge bath for awhile.Ask your caregiver when you can take a shower or a tub bath again.  Do not scratch an itchy burn. Your caregiver may give you medicine to relieve very bad itching.  Infection is a big danger after a second-degree burn. Tell your caregiver right away if you have signs of infection, such as:  Redness or changing color in the burned area.  Fluid leaking from the burn.  Swelling in the burn area.  A bad smell coming from the wound. Follow-up  Keep all follow-up appointments.This is important. This is how your caregiver can tell if your treatment is working.  Protect your burn from sunlight.Use sunscreen whenever you go outside.Burned areas may be sensitive to the sun for up to 1 year. Exposure to the sun may also cause permanent darkening of scars. SEEK MEDICAL CARE IF:  You have any questions about medicines.  You have any questions about your treatment.  You wonder if it is okay to do a particular activity.  You develop a fever of more than 100.5 F (38.1 C). SEEK IMMEDIATE MEDICAL CARE IF:  You think your burn might be infected. It may change color, become red, leak fluid, swell, or smell bad.  You develop a fever of more than 102 F (38.9 C).   This information is not intended to replace advice given to you by your health care provider. Make sure you discuss any questions you have with your health care provider.   Document Released: 11/13/2010 Document Revised: 09/03/2011 Document Reviewed: 11/13/2010 Elsevier Interactive Patient Education [employer].

## 2015-05-22 NOTE — ED Notes (Signed)
The pt is c/o a burn to her rt hand.  Hot grease splashed up onto it just pta here.  Some blisters ruptured and others intact.  She has aloe gel on her hand  lmp last week

## 2015-05-22 NOTE — ED Notes (Signed)
Patient states she was cooking and placed a piece of meat in the olive oil and it splashed up on her right hand.  Patient placed aloe cream on the hand.  Webbing and back of right hand red.  Cold saline water given and patient placed her hand in the water.

## 2015-05-23 ENCOUNTER — Emergency Department (HOSPITAL_COMMUNITY)
Admission: EM | Admit: 2015-05-23 | Discharge: 2015-05-23 | Disposition: A | Discharge status: Home / Self Care | Payer: 59 | Attending: Emergency Medicine | Admitting: Emergency Medicine

## 2015-05-23 ENCOUNTER — Encounter (HOSPITAL_COMMUNITY): Payer: Self-pay | Admitting: Family Medicine

## 2015-05-23 DIAGNOSIS — I10 Essential (primary) hypertension: Secondary | ICD-10-CM | POA: Insufficient documentation

## 2015-05-23 DIAGNOSIS — Z5189 Encounter for other specified aftercare: Secondary | ICD-10-CM

## 2015-05-23 DIAGNOSIS — E119 Type 2 diabetes mellitus without complications: Secondary | ICD-10-CM | POA: Insufficient documentation

## 2015-05-23 DIAGNOSIS — Z4800 Encounter for change or removal of nonsurgical wound dressing: Secondary | ICD-10-CM | POA: Diagnosis not present

## 2015-05-23 MED ORDER — SILVER SULFADIAZINE 1 % EX CREA
TOPICAL_CREAM | Freq: Once | CUTANEOUS | Status: AC
  Administered 2015-05-23: 12:00:00 via TOPICAL
  Filled 2015-05-23: qty 85

## 2015-05-23 NOTE — Discharge Instructions (Signed)
Keep wound clean and covered. Follow up with hand as needed. Return to ED for signs of infection.

## 2015-05-23 NOTE — ED Notes (Signed)
Declined W/C at D/C and was escorted to lobby by RN. 

## 2015-05-23 NOTE — ED Provider Notes (Signed)
CSN: [id number]     Arrival date & time 05/23/15  1036 History  By signing my name below, I, Soijett Blue, attest that this documentation has been prepared under the direction and in the presence of Josephina Gip, PA-C Electronically Signed: Soijett Blue, ED Scribe. 05/23/2015. 11:38 AM.   Chief Complaint  Patient presents with  . Burn  . Wound Check   The history is provided by the patient. No language interpreter was used.   Makayla Hoffman is a 44 y.o. female with a medical hx of HTN and DM, who presents to the ED today for a wound check. She reports that her right hand burn was caused due to hot grease splashing on her hand. She reports that she was seen in the ED on 05/22/15 for a burn to her right hand and given instructions to use silvadene and wrapping the area. She notes that she has followed these instructions. She notes that her pain has since decreased since being seen and treated last night. Pt notes that she was not given a referral to a hand specialist during her visit last night. She also reports that she has been using the silvadene cream with relief of her symptoms. She reports good range of motion in the hand. She denies drainage, fever, chills, and any other symptoms.    Past Medical History  Diagnosis Date  . Hypertension   . Diabetes mellitus without complication ([place])    History reviewed. No pertinent past surgical history. History reviewed. No pertinent family history. Social History  Substance Use Topics  . Smoking status: Never Smoker   . Smokeless tobacco: None  . Alcohol Use: No   OB History    No data available     Review of Systems  Skin: Positive for wound (burn to right hand).  All other systems reviewed and are negative.     Allergies  Review of patient's allergies indicates no known allergies.  Home Medications   Prior to Admission medications   Medication Sig Start Date End Date Taking? Authorizing Provider  oxyCODONE-acetaminophen  (PERCOCET) 7.5-325 MG tablet Take 1 tablet by mouth every 6 (six) hours as needed for severe pain. 05/22/15   Hope Bunnie Pion, NP   BP 151/85 mmHg  Pulse 78  Temp(Src) 98.2 F (36.8 C) (Oral)  Resp 18  SpO2 98%  LMP 05/15/2015 Physical Exam  Constitutional: She appears well-developed and well-nourished. No distress.  HENT:  Head: Normocephalic and atraumatic.  Right Ear: External ear normal.  Left Ear: External ear normal.  Eyes: Conjunctivae are normal. Right eye exhibits no discharge. Left eye exhibits no discharge. No scleral icterus.  Neck: Normal range of motion.  Cardiovascular: Normal rate and intact distal pulses.   Radial pulse palpable. Cap refill less than 3 seconds.  Pulmonary/Chest: Effort normal.  Musculoskeletal: Normal range of motion.  Full range of motion of right digits and wrist.  Neurological: She is alert. Coordination normal.  Skin: Skin is warm and dry.  Erythema noted to the web space between first and second digits of the right hand and thenar eminence. No open wounds or desquamation. No purulent drainage.  Psychiatric: She has a normal mood and affect. Her behavior is normal.  Nursing note and vitals reviewed.   ED Course  Procedures (including critical care time) DIAGNOSTIC STUDIES: Oxygen Saturation is 98% on RA, nl by my interpretation.    COORDINATION OF CARE: 11:38 AM Discussed treatment plan with pt at bedside which includes wound care,  continue use of silvadene cream, and referral to hand specialist and pt agreed to plan.    Labs Review Labs Reviewed - No data to display  Imaging Review No results found.    EKG Interpretation None      MDM   Final diagnoses:  Visit for wound check   Patient presenting for a wound check. She suffered a second-degree burn to her right hand due to splashed grease. She reports improvement in pain since yesterday. She has retained full range of motion of the right digits and wrist. Vital signs stable.  Patient is nontoxic-appearing. Area of erythema without blistering, open wounds or desquamation over the right webbing between 1st and second digits. No purulence or signs of infection. Patient states that her wound looks much better than it did yesterday. She is requesting referral information for a hand specialist; she was not given this yesterday though it was discussed that she might need one. Wound was redressed in the emergency department. Return precautions given in discharge paperwork and discussed with pt at bedside. Pt stable for discharge  I personally performed the services described in this documentation, which was scribed in my presence. The recorded information has been reviewed and is accurate.    Josephina Gip, PA-C 05/23/15 1206  Noemi Chapel, MD 05/25/15 1027

## 2015-05-23 NOTE — ED Notes (Signed)
Pt here for wound recheck to right hand. Seen here last night for burn. Silvadene placed on hand and wrapped. Pt sts looks and feels better,

## 2016-07-16 ENCOUNTER — Other Ambulatory Visit: Payer: Self-pay | Admitting: Family Medicine

## 2016-07-16 DIAGNOSIS — Z1231 Encounter for screening mammogram for malignant neoplasm of breast: Secondary | ICD-10-CM

## 2016-09-14 ENCOUNTER — Ambulatory Visit
Admission: RE | Admit: 2016-09-14 | Discharge: 2016-09-14 | Disposition: A | Discharge status: Home / Self Care | Payer: BC Managed Care – PPO | Source: Ambulatory Visit | Attending: Family Medicine | Admitting: Family Medicine

## 2016-09-14 ENCOUNTER — Encounter: Payer: Self-pay | Admitting: Radiology

## 2016-09-14 DIAGNOSIS — Z1231 Encounter for screening mammogram for malignant neoplasm of breast: Secondary | ICD-10-CM

## 2017-11-26 ENCOUNTER — Other Ambulatory Visit: Payer: Self-pay | Admitting: Family Medicine

## 2017-11-26 DIAGNOSIS — M5416 Radiculopathy, lumbar region: Secondary | ICD-10-CM

## 2017-12-18 ENCOUNTER — Ambulatory Visit
Admission: RE | Admit: 2017-12-18 | Discharge: 2017-12-18 | Disposition: A | Discharge status: Home / Self Care | Payer: BLUE CROSS/BLUE SHIELD | Source: Ambulatory Visit | Attending: Family Medicine | Admitting: Family Medicine

## 2017-12-18 DIAGNOSIS — M5416 Radiculopathy, lumbar region: Secondary | ICD-10-CM

## 2018-01-17 ENCOUNTER — Other Ambulatory Visit: Payer: Self-pay | Admitting: Family Medicine

## 2018-01-17 DIAGNOSIS — Z0000 Encounter for general adult medical examination without abnormal findings: Secondary | ICD-10-CM

## 2018-01-17 DIAGNOSIS — Z1231 Encounter for screening mammogram for malignant neoplasm of breast: Secondary | ICD-10-CM

## 2018-01-21 ENCOUNTER — Ambulatory Visit
Admission: RE | Admit: 2018-01-21 | Discharge: 2018-01-21 | Disposition: A | Discharge status: Home / Self Care | Payer: BLUE CROSS/BLUE SHIELD | Source: Ambulatory Visit | Attending: Family Medicine | Admitting: Family Medicine

## 2018-01-21 DIAGNOSIS — Z1231 Encounter for screening mammogram for malignant neoplasm of breast: Secondary | ICD-10-CM

## 2018-01-29 ENCOUNTER — Other Ambulatory Visit (HOSPITAL_COMMUNITY): Payer: Self-pay | Admitting: Family Medicine

## 2018-01-29 DIAGNOSIS — R911 Solitary pulmonary nodule: Secondary | ICD-10-CM

## 2018-01-30 ENCOUNTER — Other Ambulatory Visit (HOSPITAL_COMMUNITY): Payer: Self-pay | Admitting: Family Medicine

## 2018-01-30 ENCOUNTER — Other Ambulatory Visit: Payer: Self-pay | Admitting: Family Medicine

## 2018-01-30 DIAGNOSIS — R911 Solitary pulmonary nodule: Secondary | ICD-10-CM

## 2018-02-03 ENCOUNTER — Ambulatory Visit: Payer: BLUE CROSS/BLUE SHIELD

## 2018-02-05 ENCOUNTER — Ambulatory Visit: Payer: BLUE CROSS/BLUE SHIELD

## 2018-02-14 ENCOUNTER — Ambulatory Visit (HOSPITAL_COMMUNITY): Payer: BLUE CROSS/BLUE SHIELD

## 2018-02-26 ENCOUNTER — Encounter (HOSPITAL_COMMUNITY)
Admission: RE | Admit: 2018-02-26 | Discharge: 2018-02-26 | Disposition: A | Discharge status: Home / Self Care | Payer: BLUE CROSS/BLUE SHIELD | Source: Ambulatory Visit | Attending: Family Medicine | Admitting: Family Medicine

## 2018-02-26 DIAGNOSIS — R911 Solitary pulmonary nodule: Secondary | ICD-10-CM | POA: Diagnosis present

## 2018-02-26 LAB — GLUCOSE, CAPILLARY: Glucose-Capillary: 112 mg/dL — ABNORMAL HIGH (ref 70–99)

## 2018-02-26 MED ORDER — FLUDEOXYGLUCOSE F - 18 (FDG) INJECTION
12.4000 | Freq: Once | INTRAVENOUS | Status: AC
  Administered 2018-02-26: 12.4 via INTRAVENOUS

## 2019-06-04 ENCOUNTER — Other Ambulatory Visit: Payer: Self-pay | Admitting: Family Medicine

## 2019-06-04 ENCOUNTER — Other Ambulatory Visit: Payer: Self-pay

## 2019-06-04 ENCOUNTER — Ambulatory Visit
Admission: RE | Admit: 2019-06-04 | Discharge: 2019-06-04 | Disposition: A | Discharge status: Home / Self Care | Payer: PRIVATE HEALTH INSURANCE | Source: Ambulatory Visit | Attending: Family Medicine | Admitting: Family Medicine

## 2019-06-04 DIAGNOSIS — Z1231 Encounter for screening mammogram for malignant neoplasm of breast: Secondary | ICD-10-CM

## 2020-04-09 ENCOUNTER — Other Ambulatory Visit: Payer: Self-pay

## 2020-04-09 ENCOUNTER — Ambulatory Visit: Payer: Self-pay | Attending: Internal Medicine

## 2020-04-09 DIAGNOSIS — Z23 Encounter for immunization: Secondary | ICD-10-CM

## 2020-04-09 NOTE — Progress Notes (Signed)
   Covid-19 Vaccination Clinic  Name:  Makayla Hoffman    MRN: [id number] DOB: 02/15/1971  04/09/2020  Makayla Hoffman was observed post Covid-19 immunization for 15 minutes without incident. She was provided with Vaccine Information Sheet and instruction to access the V-Safe system.   Makayla Hoffman was instructed to call 911 with any severe reactions post vaccine: Marland Kitchen Difficulty breathing  . Swelling of face and throat  . A fast heartbeat  . A bad rash all over body  . Dizziness and weakness

## 2020-08-31 ENCOUNTER — Other Ambulatory Visit: Payer: Self-pay | Admitting: Family Medicine

## 2020-08-31 DIAGNOSIS — Z1231 Encounter for screening mammogram for malignant neoplasm of breast: Secondary | ICD-10-CM

## 2020-10-14 ENCOUNTER — Inpatient Hospital Stay: Admission: RE | Admit: 2020-10-14 | Payer: Federal, State, Local not specified - PPO | Source: Ambulatory Visit

## 2020-10-14 DIAGNOSIS — Z1231 Encounter for screening mammogram for malignant neoplasm of breast: Secondary | ICD-10-CM

## 2020-12-02 ENCOUNTER — Other Ambulatory Visit: Payer: Self-pay

## 2020-12-02 ENCOUNTER — Ambulatory Visit
Admission: RE | Admit: 2020-12-02 | Discharge: 2020-12-02 | Disposition: A | Discharge status: Home / Self Care | Payer: Federal, State, Local not specified - PPO | Source: Ambulatory Visit | Attending: Family Medicine | Admitting: Family Medicine

## 2020-12-02 DIAGNOSIS — Z1231 Encounter for screening mammogram for malignant neoplasm of breast: Secondary | ICD-10-CM

## 2021-04-07 LAB — EXTERNAL GENERIC LAB PROCEDURE: COLOGUARD: NEGATIVE

## 2022-03-29 ENCOUNTER — Other Ambulatory Visit: Payer: Self-pay | Admitting: Family Medicine

## 2022-03-29 DIAGNOSIS — Z1231 Encounter for screening mammogram for malignant neoplasm of breast: Secondary | ICD-10-CM

## 2022-05-02 ENCOUNTER — Ambulatory Visit: Payer: Federal, State, Local not specified - PPO

## 2022-05-24 ENCOUNTER — Ambulatory Visit
Admission: RE | Admit: 2022-05-24 | Discharge: 2022-05-24 | Disposition: A | Discharge status: Home / Self Care | Payer: Federal, State, Local not specified - PPO | Source: Ambulatory Visit | Attending: Family Medicine | Admitting: Family Medicine

## 2022-05-24 DIAGNOSIS — Z1231 Encounter for screening mammogram for malignant neoplasm of breast: Secondary | ICD-10-CM

## 2022-10-29 IMAGING — MG MM DIGITAL SCREENING BILAT W/ TOMO AND CAD
6 of 12 series · 6 of 36 positions shown · non-contrast
Comparison: Previous exam(s).

CLINICAL DATA: Screening.

EXAM:
DIGITAL SCREENING BILATERAL MAMMOGRAM WITH TOMOSYNTHESIS AND CAD
TECHNIQUE: Bilateral screening digital craniocaudal and mediolateral oblique
mammograms were obtained. Bilateral screening digital breast
tomosynthesis was performed. The images were evaluated with
computer-aided detection.

[L CC synth-2D (1 of 2)]
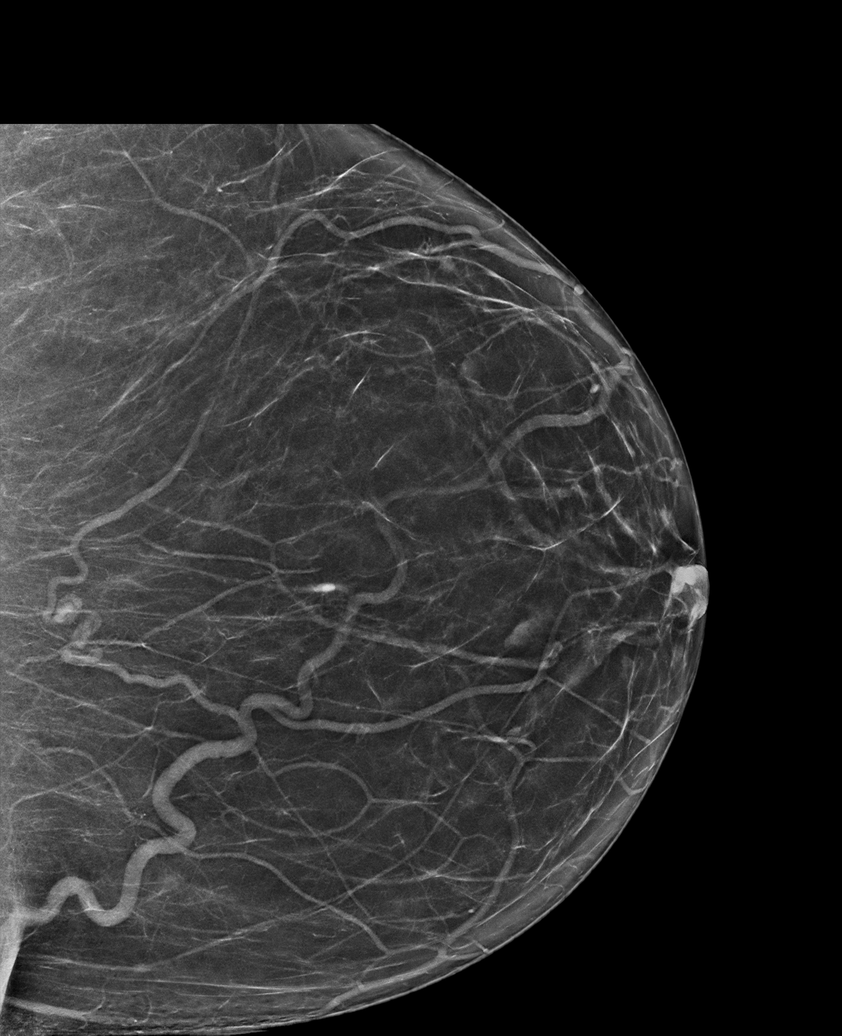

[R CC synth-2D (1 of 2)]
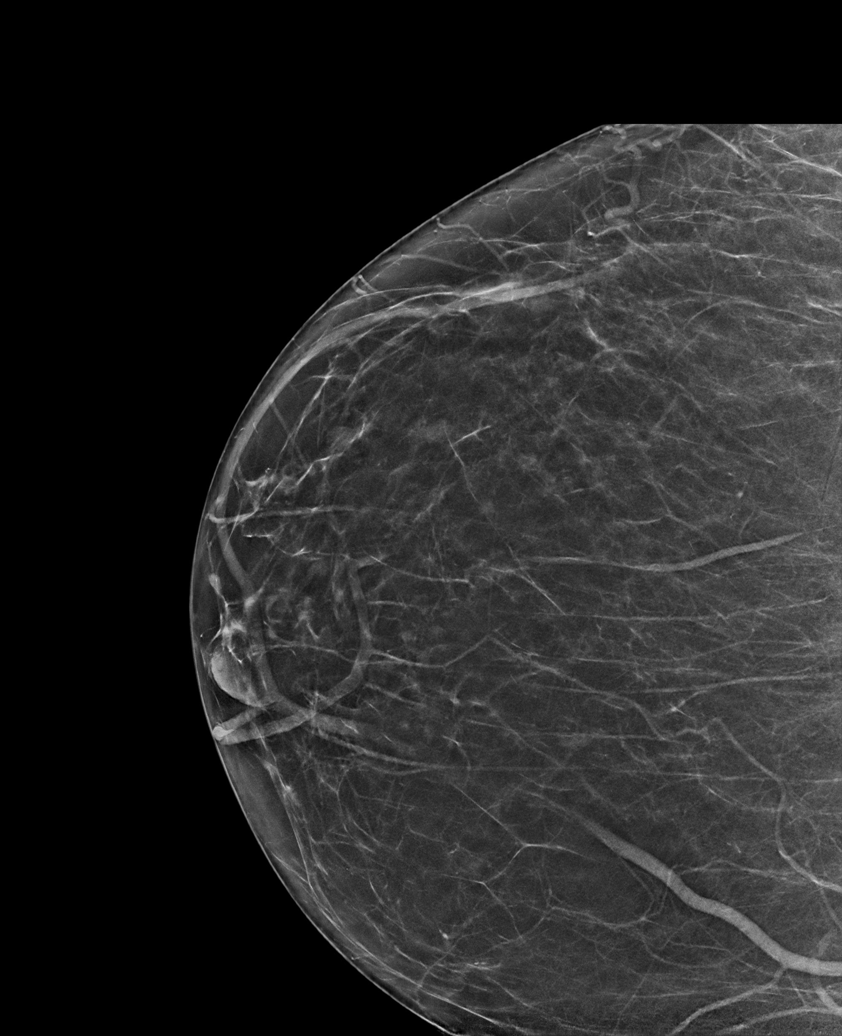

[L CC synth-2D (2 of 2)]
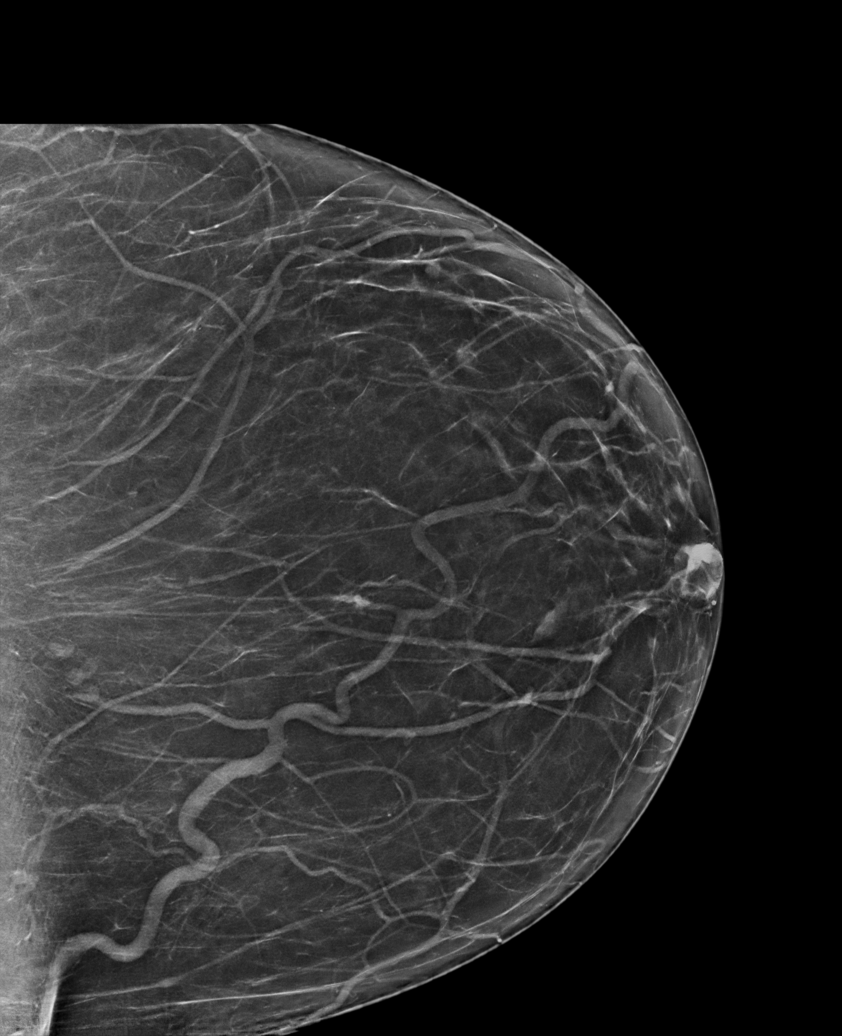

[L MLO synth-2D]
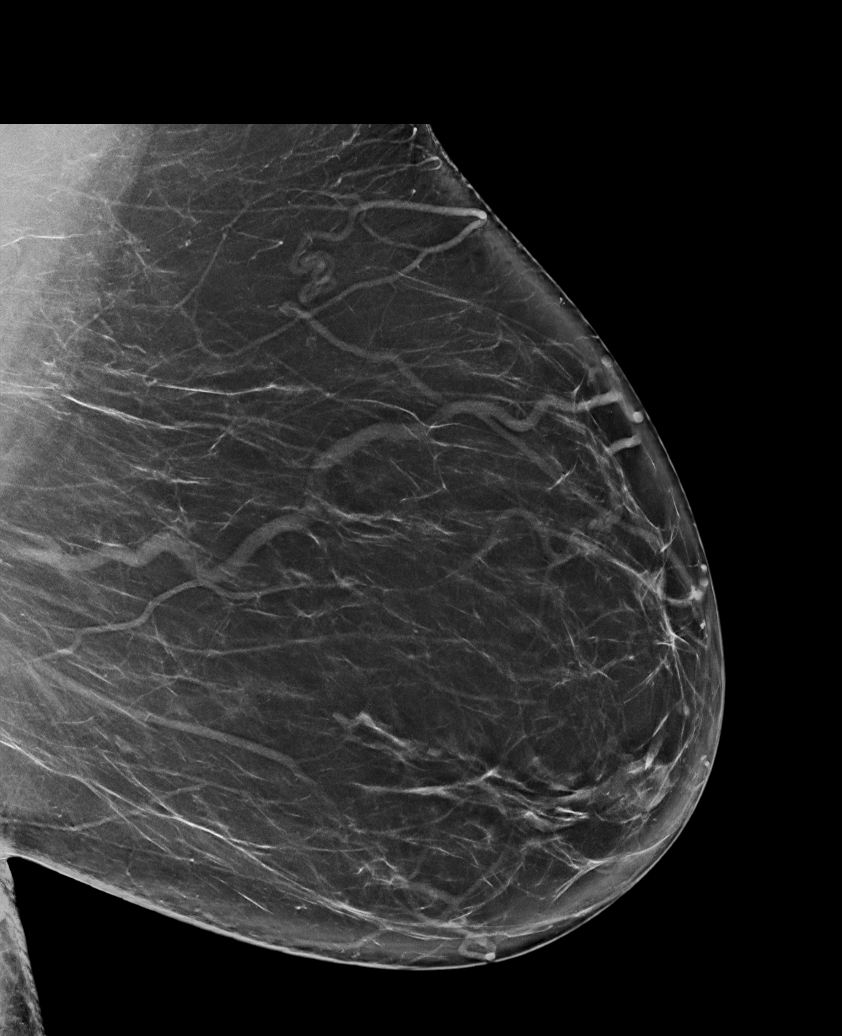

[R MLO synth-2D]
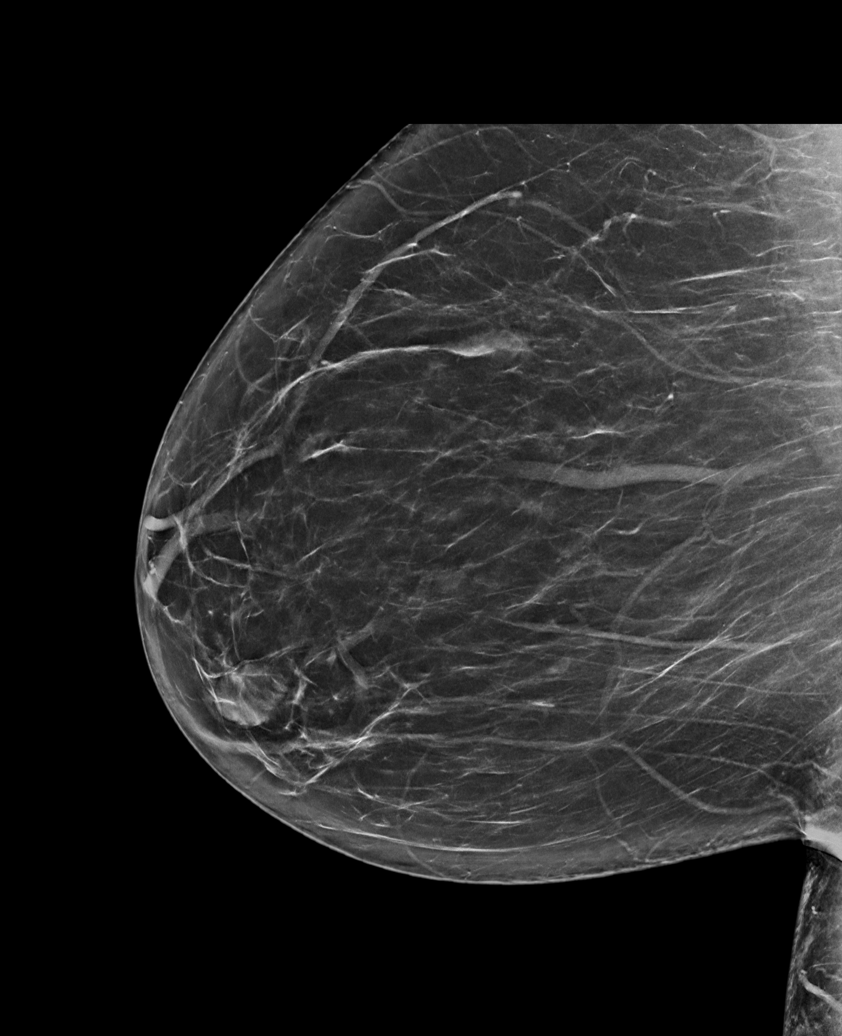

[R CC synth-2D (2 of 2)]
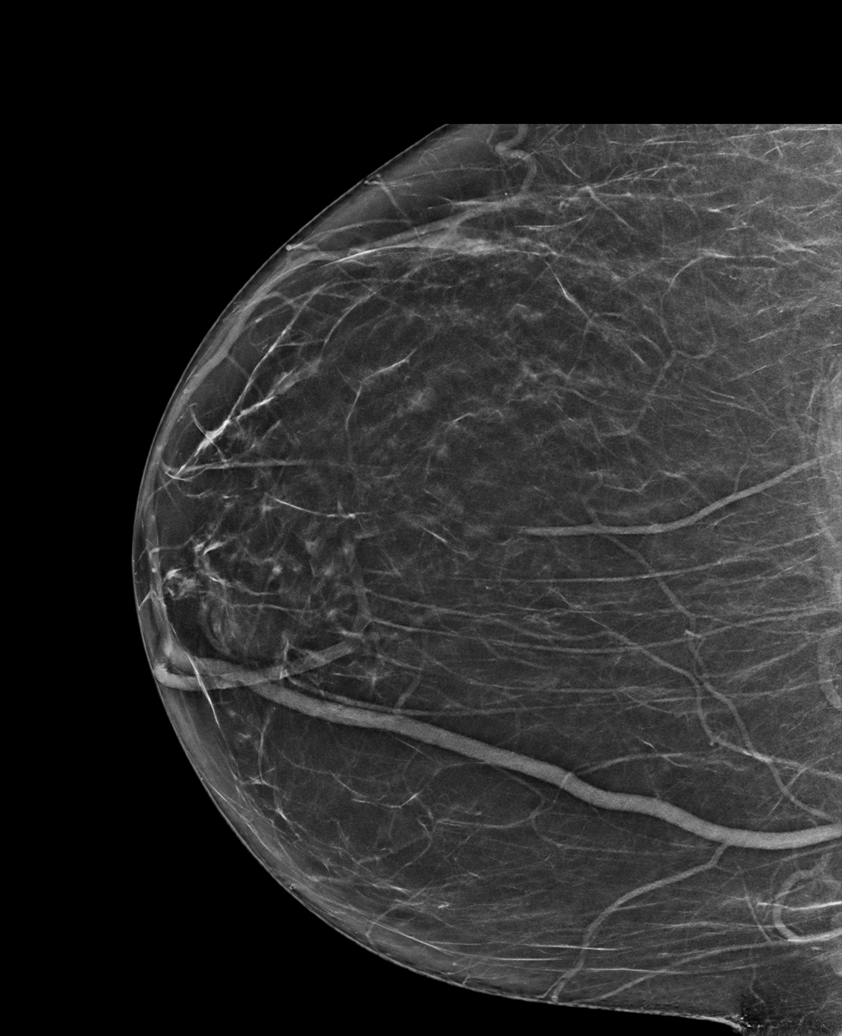

[6 of 36 positions shown; findings below may reference images not displayed]

ACR Breast Density Category b: There are scattered areas of
fibroglandular density.
FINDINGS: There are no findings suspicious for malignancy. The images were
evaluated with computer-aided detection.
IMPRESSION: No mammographic evidence of malignancy. A result letter of this
screening mammogram will be mailed directly to the patient.

RECOMMENDATION:
Screening mammogram in one year. (Code:WJ-I-BG6)

BI-RADS CATEGORY  1: Negative.

## 2023-05-06 ENCOUNTER — Other Ambulatory Visit: Payer: Self-pay | Admitting: Family Medicine

## 2023-05-06 DIAGNOSIS — Z1231 Encounter for screening mammogram for malignant neoplasm of breast: Secondary | ICD-10-CM

## 2023-06-14 ENCOUNTER — Ambulatory Visit
Admission: RE | Admit: 2023-06-14 | Discharge: 2023-06-14 | Disposition: A | Discharge status: Home / Self Care | Payer: Commercial Managed Care - PPO | Source: Ambulatory Visit | Attending: Family Medicine | Admitting: Family Medicine

## 2023-06-14 DIAGNOSIS — Z1231 Encounter for screening mammogram for malignant neoplasm of breast: Secondary | ICD-10-CM
